# Patient Record
Sex: Female | Born: 1995 | Hispanic: No | Marital: Single | Smoking: Never smoker
Health system: Southern US, Community
[De-identification: ages and names within clinical notes are randomized; demographics above are authoritative.]

---
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
## 2013-01-25 ENCOUNTER — Encounter: Payer: Self-pay | Admitting: Internal Medicine

## 2013-01-25 ENCOUNTER — Ambulatory Visit (INDEPENDENT_AMBULATORY_CARE_PROVIDER_SITE_OTHER): Payer: Managed Care, Other (non HMO) | Admitting: Internal Medicine

## 2013-01-25 VITALS — BP 116/74 | HR 80 | Temp 97.7°F | Ht 68.5 in | Wt 153.0 lb

## 2013-01-25 DIAGNOSIS — Z0000 Encounter for general adult medical examination without abnormal findings: Secondary | ICD-10-CM

## 2013-01-25 DIAGNOSIS — Z7189 Other specified counseling: Secondary | ICD-10-CM

## 2013-01-25 DIAGNOSIS — Z7184 Encounter for health counseling related to travel: Secondary | ICD-10-CM

## 2013-01-25 DIAGNOSIS — Z3009 Encounter for other general counseling and advice on contraception: Secondary | ICD-10-CM

## 2013-01-25 DIAGNOSIS — Z23 Encounter for immunization: Secondary | ICD-10-CM

## 2013-01-25 LAB — POCT URINALYSIS DIPSTICK
Blood, UA: NEGATIVE
Glucose, UA: NEGATIVE
Nitrite, UA: NEGATIVE
Protein, UA: NEGATIVE
Urobilinogen, UA: NEGATIVE

## 2013-01-25 MED ORDER — TETANUS-DIPHTH-ACELL PERTUSSIS 5-2.5-18.5 LF-MCG/0.5 IM SUSP: 0.5000 mL | Freq: Once | INTRAMUSCULAR | Status: AC

## 2013-01-26 ENCOUNTER — Encounter: Payer: Self-pay | Admitting: Internal Medicine

## 2013-01-26 LAB — CHOLESTEROL, TOTAL: Cholesterol: 160 mg/dL (ref 0–169)

## 2013-01-26 LAB — CBC WITH DIFFERENTIAL/PLATELET
Basophils Absolute: 0 10*3/uL (ref 0.0–0.1)
Basophils Relative: 1 % (ref 0–1)
Eosinophils Absolute: 0 10*3/uL (ref 0.0–0.7)
Eosinophils Relative: 1 % (ref 0–5)
MCH: 29.1 pg (ref 26.0–34.0)
MCHC: 33 g/dL (ref 30.0–36.0)
Neutrophils Relative %: 65 % (ref 43–77)
Platelets: 276 10*3/uL (ref 150–400)
RBC: 4.92 MIL/uL (ref 3.87–5.11)
RDW: 13.4 % (ref 11.5–15.5)

## 2013-02-07 ENCOUNTER — Ambulatory Visit (INDEPENDENT_AMBULATORY_CARE_PROVIDER_SITE_OTHER): Payer: Managed Care, Other (non HMO) | Admitting: Internal Medicine

## 2013-02-07 DIAGNOSIS — Z23 Encounter for immunization: Secondary | ICD-10-CM

## 2013-02-07 MED ORDER — NORGESTIM-ETH ESTRAD TRIPHASIC 0.18/0.215/0.25 MG-25 MCG PO TABS: 1.0000 | ORAL_TABLET | Freq: Every day | ORAL | Status: DC

## 2013-02-20 NOTE — Progress Notes (Signed)
  Subjective:    Patient ID: Jasmin Brown, female    DOB: 1996/06/14, 17 y.o.   MRN: [id number]  HPI 17 year old White female who will be attending college Spring 2015. This fall, she is going to [place] to travel and study before starting her freshman year in college.   She has had the Gardasil series. Had chickenpox in 1996.  Social history: Does not use illicit drugs or consume alcohol. Has an older sister who is a Holiday representative in college.  Family history: Mother with history of migraine headaches and recurrent sinusitis. Father with history of hypertension and hyperlipidemia. Sister in good health with attention deficit issues.  No history of serious illnesses     Review of Systems  Constitutional: Negative.   All other systems reviewed and are negative.       Objective:   Physical Exam  Vitals reviewed. Constitutional: She is oriented to person, place, and time. She appears well-developed and well-nourished.  HENT:  Head: Normocephalic and atraumatic.  Right Ear: External ear normal.  Left Ear: External ear normal.  Mouth/Throat: Oropharynx is clear and moist. No oropharyngeal exudate.  Eyes: Conjunctivae and EOM are normal. Pupils are equal, round, and reactive to light. Right eye exhibits no discharge. Left eye exhibits no discharge. No scleral icterus.  Neck: Neck supple. No JVD present. No thyromegaly present.  Cardiovascular: Normal rate, regular rhythm, normal heart sounds and intact distal pulses.   No murmur heard. Pulmonary/Chest: Effort normal and breath sounds normal. No respiratory distress. She has no wheezes. She has no rales.  Breasts normal female- breast exam demonstrated  Abdominal: Soft. Bowel sounds are normal. She exhibits no distension and no mass. There is no tenderness. There is no rebound and no guarding.  Musculoskeletal: Normal range of motion. She exhibits no edema.  Lymphadenopathy:    She has no cervical adenopathy.  Neurological: She is alert  and oriented to person, place, and time. No cranial nerve deficit. Coordination normal.  Skin: Skin is warm and dry. No rash noted. She is not diaphoretic.  Psychiatric: She has a normal mood and affect. Her behavior is normal. Judgment and thought content normal.          Assessment & Plan:  Health maintenance exam  Travel advice  Plan: Given Cipro 500 mg twice daily for 7 days to take with her on trip to [place] as well as Phenergan 25 mg tablets #30 one by mouth every 4 hours when necessary nausea. Oral contraceptives prescribed for dysmenorrhea and menstrual irregularity.  Needs to have Pap smear in the near future. Tetanus immunization update given.

## 2013-02-20 NOTE — Patient Instructions (Addendum)
Take Cipro and Phenergan with you to [place]. Return one year or as needed.

## 2013-06-18 ENCOUNTER — Other Ambulatory Visit: Payer: Self-pay | Admitting: Internal Medicine

## 2013-06-18 NOTE — Telephone Encounter (Signed)
Refill for one month only and

## 2013-06-20 ENCOUNTER — Ambulatory Visit (INDEPENDENT_AMBULATORY_CARE_PROVIDER_SITE_OTHER): Payer: Managed Care, Other (non HMO) | Admitting: Internal Medicine

## 2013-06-20 ENCOUNTER — Other Ambulatory Visit (HOSPITAL_COMMUNITY)
Admission: RE | Admit: 2013-06-20 | Discharge: 2013-06-20 | Disposition: A | Discharge status: Home / Self Care | Payer: 59 | Source: Ambulatory Visit | Attending: Internal Medicine | Admitting: Internal Medicine

## 2013-06-20 ENCOUNTER — Encounter: Payer: Self-pay | Admitting: Internal Medicine

## 2013-06-20 ENCOUNTER — Ambulatory Visit
Admission: RE | Admit: 2013-06-20 | Discharge: 2013-06-20 | Disposition: A | Discharge status: Home / Self Care | Payer: 59 | Source: Ambulatory Visit | Attending: Internal Medicine | Admitting: Internal Medicine

## 2013-06-20 VITALS — BP 106/84 | HR 80 | Wt 176.0 lb

## 2013-06-20 DIAGNOSIS — Z124 Encounter for screening for malignant neoplasm of cervix: Secondary | ICD-10-CM

## 2013-06-20 DIAGNOSIS — R05 Cough: Secondary | ICD-10-CM

## 2013-06-20 DIAGNOSIS — Z01419 Encounter for gynecological examination (general) (routine) without abnormal findings: Secondary | ICD-10-CM | POA: Insufficient documentation

## 2013-06-20 DIAGNOSIS — J209 Acute bronchitis, unspecified: Secondary | ICD-10-CM

## 2013-06-20 DIAGNOSIS — Z23 Encounter for immunization: Secondary | ICD-10-CM

## 2013-06-20 MED ORDER — LEVOFLOXACIN 500 MG PO TABS: 500.0000 mg | ORAL_TABLET | Freq: Every day | ORAL | Status: AC

## 2013-06-30 NOTE — Progress Notes (Signed)
Patient informed. She is away at school now, but will call upon her return to [place].

## 2013-11-25 NOTE — Patient Instructions (Signed)
Take antibiotics as prescribed for bronchitis.

## 2013-11-25 NOTE — Progress Notes (Signed)
   Subjective:    Patient ID: Jasmin Brown, female    DOB: 1996-05-05, 18 y.o.   MRN: [id number]  HPI This fall, she studied and traveled in [place]. Will be starting college in January. This will be her freshman year. She is here for Pap smear but is on menstrual period. However she has come down with respiratory infection since arriving home from [place]. Has a cough and congestion. No shaking chills. Has had fever.    Review of Systems     Objective:   Physical Exam pharynx slightly injected. TMs slightly full. Neck supple. Chest scattered wheezing. Pap smear attempted.        Assessment & Plan:  Bronchitis  Plan: Levaquin 500 milligrams daily for 10 days by mouth. Chest x-ray performed shows no infiltrate.  Addendum: Pap smear is unsatisfactory due to scant cellularity. Patient will need to return for repeat Pap in the near future. Flu vaccine given.

## 2014-02-05 ENCOUNTER — Telehealth: Payer: Self-pay | Admitting: Internal Medicine

## 2014-02-05 MED ORDER — NORGESTIM-ETH ESTRAD TRIPHASIC 0.18/0.215/0.25 MG-25 MCG PO TABS: ORAL_TABLET | ORAL | Status: DC

## 2014-02-05 NOTE — Telephone Encounter (Signed)
Patient never returned for followup Pap smear which needed to be repeated due to scant cellularity and menses 06/20/2013. Patient is aware that she never returned a says she forgot. Now drugstores requesting refill on Ortho Tri-Cyclen Lo. Have given her one 28 day pack with 2 refills. I have spoken with patient personally today and she is aware she will need to have this Pap repeated in the next 3 months or will not get any more refills. She just returned to school today in [place]. She is asking if she can have Pap done in [place] and that would be acceptable if we have a copy.

## 2014-03-31 ENCOUNTER — Ambulatory Visit (INDEPENDENT_AMBULATORY_CARE_PROVIDER_SITE_OTHER): Payer: BC Managed Care – PPO | Admitting: Family Medicine

## 2014-03-31 VITALS — BP 118/72 | HR 84 | Temp 98.4°F | Resp 18 | Ht 68.5 in | Wt 159.6 lb

## 2014-03-31 DIAGNOSIS — Z3009 Encounter for other general counseling and advice on contraception: Secondary | ICD-10-CM

## 2014-03-31 DIAGNOSIS — N898 Other specified noninflammatory disorders of vagina: Secondary | ICD-10-CM

## 2014-03-31 LAB — POCT WET PREP WITH KOH
KOH Prep POC: NEGATIVE
TRICHOMONAS UA: NEGATIVE
WBC Wet Prep HPF POC: NEGATIVE
YEAST WET PREP PER HPF POC: NEGATIVE

## 2014-03-31 MED ORDER — NORGESTIM-ETH ESTRAD TRIPHASIC 0.18/0.215/0.25 MG-25 MCG PO TABS: 1.0000 | ORAL_TABLET | Freq: Every day | ORAL | Status: DC

## 2014-03-31 NOTE — Progress Notes (Signed)
This chart was scribed for Norberto SorensonEva Shaw, MD by Luisa DagoPriscilla Tutu, ED Scribe. This patient was seen in room 13 and the patient's care was started at 4:39 PM.  Subjective:    Patient ID: Jasmin Brown, female    DOB: 1995/09/17, 18 y.o.   MRN: [id number]  Chief Complaint  Patient presents with  . Gynecologic Exam    will need for birth control, fax results to GYN, she was booked and is having done here    HPI Jasmin Brown is a 18 y.o. female with no listed active problem list.  Pt is requesting a pap smear in order to get her birth control refilled. Advised pt that she is under the required age, and that I would be happy to refill her birth control medication. Pt agrees with the proposed course of treatment. Ms. Gaspar Biddingustay endorses mild vaginal discharge and being sexually active. She also endorses normal menstruation when on her birth control. Denies any spotting or adverse effects from her birth control pills.    There are no active problems to display for this patient.  History reviewed. No pertinent past medical history. History reviewed. No pertinent past surgical history. No Known Allergies Prior to Admission medications   Medication Sig Start Date End Date Taking? Authorizing Provider  lisdexamfetamine (VYVANSE) 40 MG capsule Take 40 mg by mouth every morning.   Yes Historical Provider, MD  Norgestimate-Ethinyl Estradiol Triphasic (ORTHO TRI-CYCLEN LO) 0.18/0.215/0.25 MG-25 MCG tab TAKE AS DIRECTED 02/05/14  Yes Margaree MackintoshMary J Baxley, MD  cetirizine (ZYRTEC) 10 MG tablet Take 10 mg by mouth daily.    Historical Provider, MD  levofloxacin (LEVAQUIN) 500 MG tablet Take 1 tablet (500 mg total) by mouth daily. 06/20/13   Margaree MackintoshMary J Baxley, MD  Multiple Vitamin (MULTIVITAMIN) capsule Take 1 capsule by mouth daily.    Historical Provider, MD   History   Social History  . Marital Status: Single    Spouse Name: N/A    Number of Children: N/A  . Years of Education: N/A   Occupational History  . Not on  file.   Social History Main Topics  . Smoking status: Never Smoker   . Smokeless tobacco: Never Used  . Alcohol Use: Yes  . Drug Use: No  . Sexual Activity: Not on file   Other Topics Concern  . Not on file   Social History Narrative  . No narrative on file   Review of Systems  Constitutional: Negative for fever, chills and diaphoresis.  HENT: Negative for congestion, ear discharge and sinus pressure.   Eyes: Negative for discharge.  Respiratory: Negative for cough.   Cardiovascular: Negative for chest pain.  Gastrointestinal: Negative for abdominal pain and diarrhea.  Genitourinary: Negative for frequency and hematuria.  Musculoskeletal: Negative for back pain.  Skin: Negative for rash.  Neurological: Negative for seizures and headaches.  Psychiatric/Behavioral: Negative for hallucinations.      Triage vitals: BP 118/72  Pulse 84  Temp(Src) 98.4 F (36.9 C) (Oral)  Resp 18  Ht 5' 8.5" (1.74 m)  Wt 159 lb 9.6 oz (72.394 kg)  BMI 23.91 kg/m2  SpO2 100%  LMP 03/02/2014  Objective:   Physical Exam  Nursing note and vitals reviewed. Constitutional: She appears well-developed and well-nourished. No distress.  HENT:  Head: Normocephalic and atraumatic.  Eyes: Conjunctivae are normal. Right eye exhibits no discharge. Left eye exhibits no discharge.  Neck: Neck supple.  Cardiovascular: Normal rate, regular rhythm and normal heart sounds.  Exam reveals no  gallop and no friction rub.   No murmur heard. Pulmonary/Chest: Effort normal and breath sounds normal. No respiratory distress.  Abdominal: Soft. She exhibits no distension. There is no tenderness.  Genitourinary: Vagina normal. No labial fusion. There is no rash, tenderness, lesion or injury on the right labia. There is no rash, tenderness, lesion or injury on the left labia. Cervix exhibits discharge. Cervix exhibits no motion tenderness and no friability. Right adnexum displays no mass, no tenderness and no fullness.  Left adnexum displays no mass, no tenderness and no fullness.  Moderate amount mucoid white vaginal discharge.  Musculoskeletal: She exhibits no edema and no tenderness.  Neurological: She is alert.  Skin: Skin is warm and dry.  Psychiatric: She has a normal mood and affect. Her behavior is normal. Thought content normal.          Assessment & Plan:   Vaginal discharge - Plan: POCT Wet Prep with KOH, GC/Chlamydia Probe Amp - pt reassured no abnormality  Family planning - Plan: POCT Wet Prep with KOH, GC/Chlamydia Probe Amp - Pt's PCP is Dr. Lorenz CoasterBaxter - she plans to follow-up w/ her but was only in [place] this weekend so needed to get her OC[facility] refilled now - [facility].  No pap smear until 18 yo - 2017. Pelvic exam nml w/ neg sti testing.  OCP refilled.  Meds ordered this encounter  Medications  . lisdexamfetamine (VYVANSE) 40 MG capsule    Sig: Take 40 mg by mouth every morning.  . Norgestimate-Ethinyl Estradiol Triphasic (ORTHO TRI-CYCLEN LO) 0.18/0.215/0.25 MG-25 MCG tab    Sig: Take 1 tablet by mouth daily. TAKE AS DIRECTED    Dispense:  3 Package    Refill:  4    I personally performed the services described in this documentation, which was scribed in my presence. The recorded information has been reviewed and considered, and addended by me as needed.  Norberto SorensonEva Shaw, MD MPH  Results for orders placed in visit on 03/31/14  GC/CHLAMYDIA PROBE AMP      Result Value Ref Range   CT Probe RNA NEGATIVE     GC Probe RNA NEGATIVE    POCT WET PREP WITH KOH      Result Value Ref Range   Trichomonas, UA Negative     Clue Cells Wet Prep HPF POC 0-1     Epithelial Wet Prep HPF POC 6-15     Yeast Wet Prep HPF POC neg     Bacteria Wet Prep HPF POC 3+     RBC Wet Prep HPF POC 0-4     WBC Wet Prep HPF POC neg     KOH Prep POC Negative

## 2014-03-31 NOTE — Patient Instructions (Signed)
Oral Contraception Information Oral contraceptive pills (OCPs) are medicines taken to prevent pregnancy. OCPs work by preventing the ovaries from releasing eggs. The hormones in OCPs also cause the cervical mucus to thicken, preventing the sperm from entering the uterus. The hormones also cause the uterine lining to become thin, not allowing a fertilized egg to attach to the inside of the uterus. OCPs are highly effective when taken exactly as prescribed. However, OCPs do not prevent sexually transmitted diseases (STDs). Safe sex practices, such as using condoms along with the pill, can help prevent STDs.  Before taking the pill, you may have a physical exam and Pap test. Your health care provider may order blood tests. The health care provider will make sure you are a good candidate for oral contraception. Discuss with your health care provider the possible side effects of the OCP you may be prescribed. When starting an OCP, it can take 2 to 3 months for the body to adjust to the changes in hormone levels in your body.  TYPES OF ORAL CONTRACEPTION  The combination pill--This pill contains estrogen and progestin (synthetic progesterone) hormones. The combination pill comes in 21-day, 28-day, or 91-day packs. Some types of combination pills are meant to be taken continuously (365-day pills). With 21-day packs, you do not take pills for 7 days after the last pill. With 28-day packs, the pill is taken every day. The last 7 pills are without hormones. Certain types of pills have more than 21 hormone-containing pills. With 91-day packs, the first 84 pills contain both hormones, and the last 7 pills contain no hormones or contain estrogen only.  The minipill--This pill contains the progesterone hormone only. The pill is taken every day continuously. It is very important to take the pill at the same time each day. The minipill comes in packs of 28 pills. All 28 pills contain the hormone.  ADVANTAGES OF ORAL  CONTRACEPTIVE PILLS  Decreases premenstrual symptoms.   Treats menstrual period cramps.   Regulates the menstrual cycle.   Decreases a heavy menstrual flow.   May treatacne, depending on the type of pill.   Treats abnormal uterine bleeding.   Treats polycystic ovarian syndrome.   Treats endometriosis.   Can be used as emergency contraception.  THINGS THAT CAN MAKE ORAL CONTRACEPTIVE PILLS LESS EFFECTIVE OCPs can be less effective if:   You forget to take the pill at the same time every day.   You have a stomach or intestinal disease that lessens the absorption of the pill.   You take OCPs with other medicines that make OCPs less effective, such as antibiotics, certain HIV medicines, and some seizure medicines.   You take expired OCPs.   You forget to restart the pill on day 7, when using the packs of 21 pills.  RISKS ASSOCIATED WITH ORAL CONTRACEPTIVE PILLS  Oral contraceptive pills can sometimes cause side effects, such as:  Headache.  Nausea.  Breast tenderness.  Irregular bleeding or spotting. Combination pills are also associated with a small increased risk of:  Blood clots.  Heart attack.  Stroke. Document Released: 08/29/2002 Document Revised: 03/29/2013 Document Reviewed: 11/27/2012 [facility] Patient Information 2015 [place], [place]. This information is not intended to replace advice given to you by your health care provider. Make sure you discuss any questions you have with your health care provider.  Oral Contraception Use Oral contraceptive pills (OCPs) are medicines taken to prevent pregnancy. OCPs work by preventing the ovaries from releasing eggs. The hormones in OCPs also  cause the cervical mucus to thicken, preventing the sperm from entering the uterus. The hormones also cause the uterine lining to become thin, not allowing a fertilized egg to attach to the inside of the uterus. OCPs are highly effective when taken exactly as  prescribed. However, OCPs do not prevent sexually transmitted diseases (STDs). Safe sex practices, such as using condoms along with an OCP, can help prevent STDs. Before taking OCPs, you may have a physical exam and Pap test. Your health care provider may also order blood tests if necessary. Your health care provider will make sure you are a good candidate for oral contraception. Discuss with your health care provider the possible side effects of the OCP you may be prescribed. When starting an OCP, it can take 2 to 3 months for the body to adjust to the changes in hormone levels in your body.  HOW TO TAKE ORAL CONTRACEPTIVE PILLS Your health care provider may advise you on how to start taking the first cycle of OCPs. Otherwise, you can:   Start on day 1 of your menstrual period. You will not need any backup contraceptive protection with this start time.   Start on the first Sunday after your menstrual period or the day you get your prescription. In these cases, you will need to use backup contraceptive protection for the first week.   Start the pill at any time of your cycle. If you take the pill within 5 days of the start of your period, you are protected against pregnancy right away. In this case, you will not need a backup form of birth control. If you start at any other time of your menstrual cycle, you will need to use another form of birth control for 7 days. If your OCP is the type called a minipill, it will protect you from pregnancy after taking it for 2 days (48 hours). After you have started taking OCPs:   If you forget to take 1 pill, take it as soon as you remember. Take the next pill at the regular time.   If you miss 2 or more pills, call your health care provider because different pills have different instructions for missed doses. Use backup birth control until your next menstrual period starts.   If you use a 28-day pack that contains inactive pills and you miss 1 of the last 7  pills (pills with no hormones), it will not matter. Throw away the rest of the non-hormone pills and start a new pill pack.  No matter which day you start the OCP, you will always start a new pack on that same day of the week. Have an extra pack of OCPs and a backup contraceptive method available in case you miss some pills or lose your OCP pack.  HOME CARE INSTRUCTIONS   Do not smoke.   Always use a condom to protect against STDs. OCPs do not protect against STDs.   Use a calendar to mark your menstrual period days.   Read the information and directions that came with your OCP. Talk to your health care provider if you have questions.  SEEK MEDICAL CARE IF:   You develop nausea and vomiting.   You have abnormal vaginal discharge or bleeding.   You develop a rash.   You miss your menstrual period.   You are losing your hair.   You need treatment for mood swings or depression.   You get dizzy when taking the OCP.   You develop acne  from taking the OCP.   You become pregnant.  SEEK IMMEDIATE MEDICAL CARE IF:   You develop chest pain.   You develop shortness of breath.   You have an uncontrolled or severe headache.   You develop numbness or slurred speech.   You develop visual problems.   You develop pain, redness, and swelling in the legs.  Document Released: 05/28/2011 Document Revised: 10/23/2013 Document Reviewed: 11/27/2012 [facility] Patient Information 2015 [place]. This information is not intended to replace advice given to you by your health care provider. Make sure you discuss any questions you have with your health care provider. Keeping You Healthy  Get These Tests 1. Blood Pressure- Have your blood pressure checked once a year by your health care provider.  Normal blood pressure is 120/80. 2. Weight- Have your body mass index (BMI) calculated to screen for obesity.  BMI is measure of body fat based on height and weight.  You can also  calculate your own BMI at https://www.west-esparza.com/www.nhlbisupport.com/bmi/. 3. Cholesterol- Have your cholesterol checked every 5 years starting at age 20 then yearly starting at age 20. 4. Chlamydia, HIV, and other sexually transmitted diseases- Get screened every year until age 20, then within three months of each new sexual provider. 5. Pap Smear- Every 1-3 years; discuss with your health care provider. 6. Mammogram- Every year starting at age 20  Take these medicines  Calcium with Vitamin D-Your body needs 1200 mg of Calcium each day and [phone] IU of Vitamin D daily.  Your body can only absorb 500 mg of Calcium at a time so Calcium must be taken in 2 or 3 divided doses throughout the day.  Multivitamin with folic acid- Once daily if it is possible for you to become pregnant.  Get these Immunizations  Gardasil-Series of three doses; prevents HPV related illness such as genital warts and cervical cancer.  Menactra-Single dose; prevents meningitis.  Tetanus shot- Every 10 years.  Flu shot-Every year.  Take these steps 1. Do not smoke-Your healthcare provider can help you quit.  For tips on how to quit go to www.smokefree.gov or call 1-800 QUITNOW. 2. Be physically active- Exercise 5 days a week for at least 30 minutes.  If you are not already physically active, start slow and gradually work up to 30 minutes of moderate physical activity.  Examples of moderate activity include walking briskly, dancing, swimming, bicycling, etc. 3. Breast Cancer- A self breast exam every month is important for early detection of breast cancer.  For more information and instruction on self breast exams, ask your healthcare provider or [place].eswww.womenshealth.gov/faq/breast-self-exam.cfm. 4. Eat a healthy diet- Eat a variety of healthy foods such as fruits, vegetables, whole grains, low fat milk, low fat cheeses, yogurt, lean meats, poultry and fish, beans, nuts, tofu, etc.  For more information go to www.  Thenutritionsource.org 5. Drink alcohol in moderation- Limit alcohol intake to one drink or less per day. Never drink and drive. 6. Depression- Your emotional health is as important as your physical health.  If you're feeling down or losing interest in things you normally enjoy please talk to your healthcare provider about being screened for depression. 7. Dental visit- Brush and floss your teeth twice daily; visit your dentist twice a year. 8. Eye doctor- Get an eye exam at least every 2 years. 9. Helmet use- Always wear a helmet when riding a bicycle, motorcycle, rollerblading or skateboarding. 10. Safe sex- If you may be exposed to sexually transmitted infections, use a condom.  11. Seat belts- Seat belts can save your live; always wear one. 12. Smoke/Carbon Monoxide detectors- These detectors need to be installed on the appropriate level of your home. Replace batteries at least once a year. 13. Skin cancer- When out in the sun please cover up and use sunscreen 15 SPF or higher. 14. Violence- If anyone is threatening or hurting you, please tell your healthcare provider.

## 2014-04-02 LAB — GC/CHLAMYDIA PROBE AMP
CT PROBE, AMP APTIMA: NEGATIVE
GC Probe RNA: NEGATIVE

## 2014-04-04 ENCOUNTER — Encounter: Payer: Self-pay | Admitting: Family Medicine

## 2015-05-30 ENCOUNTER — Other Ambulatory Visit: Payer: Self-pay | Admitting: Family Medicine

## 2017-10-22 ENCOUNTER — Ambulatory Visit (HOSPITAL_COMMUNITY)
Admission: EM | Admit: 2017-10-22 | Discharge: 2017-10-22 | Disposition: A | Discharge status: Home / Self Care | Payer: BLUE CROSS/BLUE SHIELD | Attending: Internal Medicine | Admitting: Internal Medicine

## 2017-10-22 ENCOUNTER — Ambulatory Visit (INDEPENDENT_AMBULATORY_CARE_PROVIDER_SITE_OTHER): Payer: BLUE CROSS/BLUE SHIELD

## 2017-10-22 ENCOUNTER — Encounter (HOSPITAL_COMMUNITY): Payer: Self-pay

## 2017-10-22 ENCOUNTER — Other Ambulatory Visit: Payer: Self-pay

## 2017-10-22 DIAGNOSIS — S93602A Unspecified sprain of left foot, initial encounter: Secondary | ICD-10-CM

## 2017-10-22 MED ORDER — IBUPROFEN 800 MG PO TABS
800.0000 mg | ORAL_TABLET | Freq: Once | ORAL | Status: AC
  Administered 2017-10-22: 800 mg via ORAL

## 2017-10-22 MED ORDER — IBUPROFEN 800 MG PO TABS
ORAL_TABLET | ORAL | Status: AC
  Filled 2017-10-22: qty 1

## 2017-10-22 NOTE — ED Triage Notes (Signed)
Pt presents with complaints of hurting her left foot during playing basketball. Swelling present.

## 2017-10-22 NOTE — Discharge Instructions (Signed)
Use anti-inflammatories for pain/swelling. You may take up to 800 mg Ibuprofen every 8 hours with food. You may supplement Ibuprofen with Tylenol [phone] mg every 8 hours. Or aleeve  I expect gradual resolution over the next 2 weeks.  Use crutches and postop shoe as needed for comfort.  Slowly transition back into weightbearing.  Please follow-up if symptoms not improving as expected or symptoms are worsening.

## 2017-10-22 NOTE — ED Provider Notes (Signed)
[facility]    CSN: [id number] Arrival date & time: 10/22/17  7829     History   Chief Complaint Chief Complaint  Patient presents with  . Foot Pain    HPI Jasmin Brown is a 22 y.o. female presenting today for evaluation of left foot injury.  Patient was playing basketball earlier today and landed on the edge of a raised basketball court, half of her foot was on the court half is not.  Patient fell.  She is unsure exactly how her foot landed.  Since she has had pain, swelling and significant pain with weightbearing.  Denies numbness or tingling.  Iced her foot earlier, but is not taking any anti-inflammatories.  HPI  History reviewed. No pertinent past medical history.  There are no active problems to display for this patient.   No past surgical history on file.  OB History   None      Home Medications    Prior to Admission medications   Medication Sig Start Date End Date Taking? Authorizing Provider  lisdexamfetamine (VYVANSE) 40 MG capsule Take 40 mg by mouth every morning.   Yes [provider]  cetirizine (ZYRTEC) 10 MG tablet Take 10 mg by mouth daily.    [provider]  levofloxacin (LEVAQUIN) 500 MG tablet Take 1 tablet (500 mg total) by mouth daily. 06/20/13   Margaree Mackintosh, MD  Multiple Vitamin (MULTIVITAMIN) capsule Take 1 capsule by mouth daily.    [provider]  Norgestimate-Ethinyl Estradiol Triphasic 0.18/0.215/0.25 MG-25 MCG tab Take 1 tablet by mouth daily. PATIENT NEEDS OFFICE VISIT FOR ADDITIONAL REFILLS 05/31/15   Wallis Bamberg, PA-C    Family History Family History  Problem Relation Age of Onset  . Hypertension Father     Social History Social History   Tobacco Use  . Smoking status: Never Smoker  . Smokeless tobacco: Never Used  Substance Use Topics  . Alcohol use: Yes  . Drug use: No     Allergies   Patient has no known allergies.   Review of Systems Review of Systems  Constitutional:  Negative for activity change and appetite change.  HENT: Negative for trouble swallowing.   Eyes: Negative for pain and visual disturbance.  Respiratory: Negative for shortness of breath.   Cardiovascular: Negative for chest pain.  Gastrointestinal: Negative for abdominal pain, nausea and vomiting.  Musculoskeletal: Positive for arthralgias, gait problem and myalgias. Negative for back pain, neck pain and neck stiffness.  Skin: Negative for color change and wound.  Neurological: Negative for dizziness, seizures, syncope, weakness, light-headedness, numbness and headaches.     Physical Exam Triage Vital Signs ED Triage Vitals [10/22/17 1932]  Enc Vitals Group     BP (!) 145/97     Pulse Rate 99     Resp 18     Temp 98 F (36.7 C)     Temp src      SpO2 100 %     Weight 159 lb (72.1 kg)     Height      Head Circumference      Peak Flow      Pain Score 6     Pain Loc      Pain Edu?      Excl. in GC?    No data found.  Updated Vital Signs BP (!) 145/97   Pulse 99   Temp 98 F (36.7 C)   Resp 18   Wt 159 lb (72.1 kg)  LMP 09/22/2017   SpO2 100%   BMI 23.82 kg/m   Visual Acuity Right Eye Distance:   Left Eye Distance:   Bilateral Distance:    Right Eye Near:   Left Eye Near:    Bilateral Near:     Physical Exam  Constitutional: She appears well-developed and well-nourished. No distress.  HENT:  Head: Normocephalic and atraumatic.  Eyes: Conjunctivae are normal.  Neck: Neck supple.  Cardiovascular: Normal rate.  No murmur heard. Pulmonary/Chest: Effort normal. No respiratory distress.  Musculoskeletal: She exhibits tenderness. She exhibits no edema.  Mild swelling diffusely across metacarpals. tenderness across first through fifth metacarpals on plantar and dorsal surface of foot.  Dorsalis pedis 2+.  Cap refill less than 2 seconds, able to wiggle toes.  Mild tenderness to palpation just inferior to lateral malleolus, nontender over medial malleolus    Neurological: She is alert.  Skin: Skin is warm and dry.  Psychiatric: She has a normal mood and affect.  Nursing note and vitals reviewed.    UC Treatments / Results  Labs (all labs ordered are listed, but only abnormal results are displayed) Labs Reviewed - No data to display  EKG None  Radiology Dg Foot Complete Left  Result Date: 10/22/2017 CLINICAL DATA:  Swelling, injury EXAM: LEFT FOOT - COMPLETE 3+ VIEW COMPARISON:  None. FINDINGS: There is no evidence of fracture or dislocation. There is no evidence of arthropathy or other focal bone abnormality. Soft tissues are unremarkable. Ossicle adjacent to the dorsal navicular. IMPRESSION: No acute osseous abnormality. Electronically Signed   By: Jasmine Pang M.D.   On: 10/22/2017 19:58    Procedures Procedures (including critical care time)  Medications Ordered in UC Medications  ibuprofen (ADVIL,MOTRIN) tablet 800 mg (800 mg Oral Given 10/22/17 1959)    Initial Impression / Assessment and Plan / UC Course  I have reviewed the triage vital signs and the nursing notes.  Pertinent labs & imaging results that were available during my care of the patient were reviewed by me and considered in my medical decision making (see chart for details).     X-ray negative for acute fracture.  Likely foot sprain.  Will provide postop shoe, crutches.  Rest, anti-inflammatories and ice. Discussed strict return precautions. Patient verbalized understanding and is agreeable with plan.  Final Clinical Impressions(s) / UC Diagnoses   Final diagnoses:  Sprain of left foot, initial encounter     Discharge Instructions     Use anti-inflammatories for pain/swelling. You may take up to 800 mg Ibuprofen every 8 hours with food. You may supplement Ibuprofen with Tylenol [phone] mg every 8 hours. Or aleeve  I expect gradual resolution over the next 2 weeks.  Use crutches and postop shoe as needed for comfort.  Slowly transition back into  weightbearing.  Please follow-up if symptoms not improving as expected or symptoms are worsening.   ED Prescriptions    None     Controlled Substance Prescriptions [place] Controlled Substance Registry consulted? Not Applicable   Lew Dawes, [place] 10/22/17 2059

## 2019-07-25 IMAGING — DX DG FOOT COMPLETE 3+V*L*
3 series · 3 of 3 positions shown · non-contrast
Comparison: None.

CLINICAL DATA: Swelling, injury

EXAM:
LEFT FOOT - COMPLETE 3+ VIEW

[foot ap]
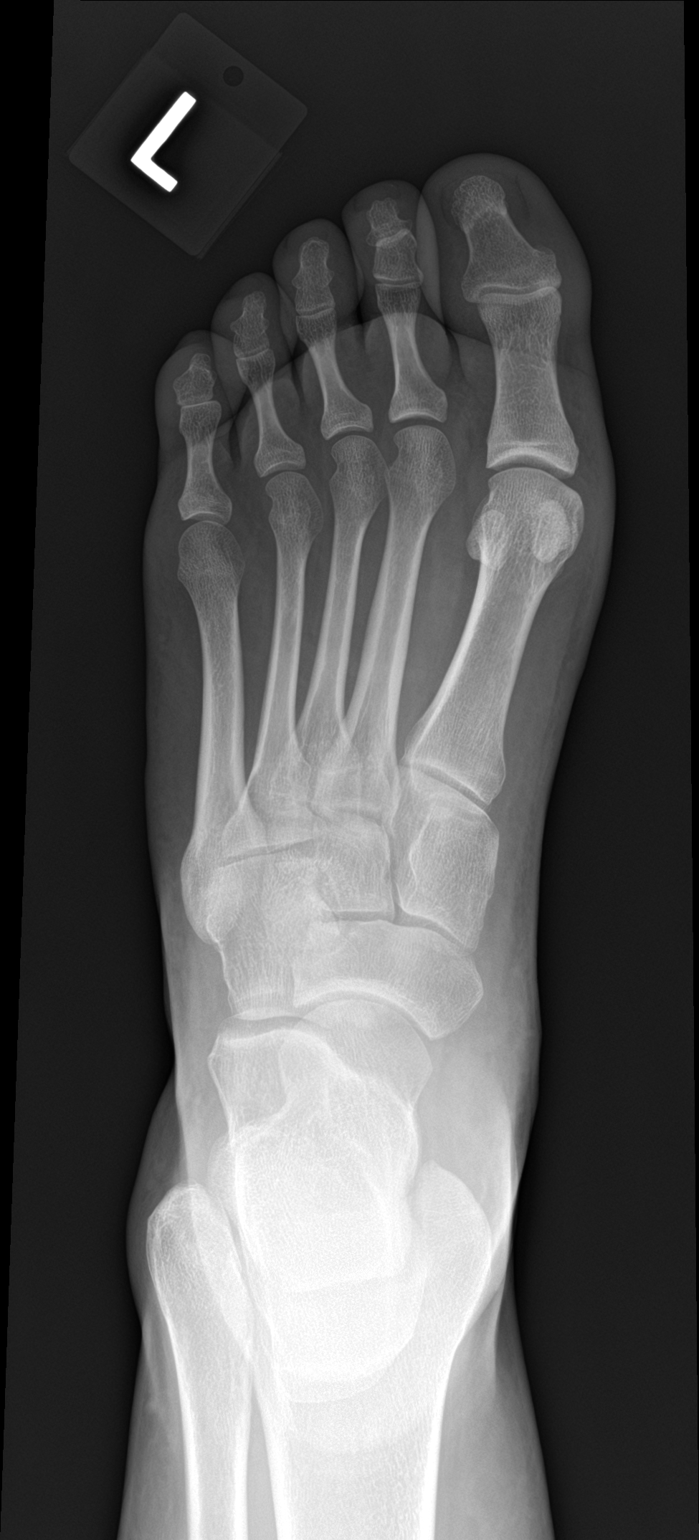

[foot obl]
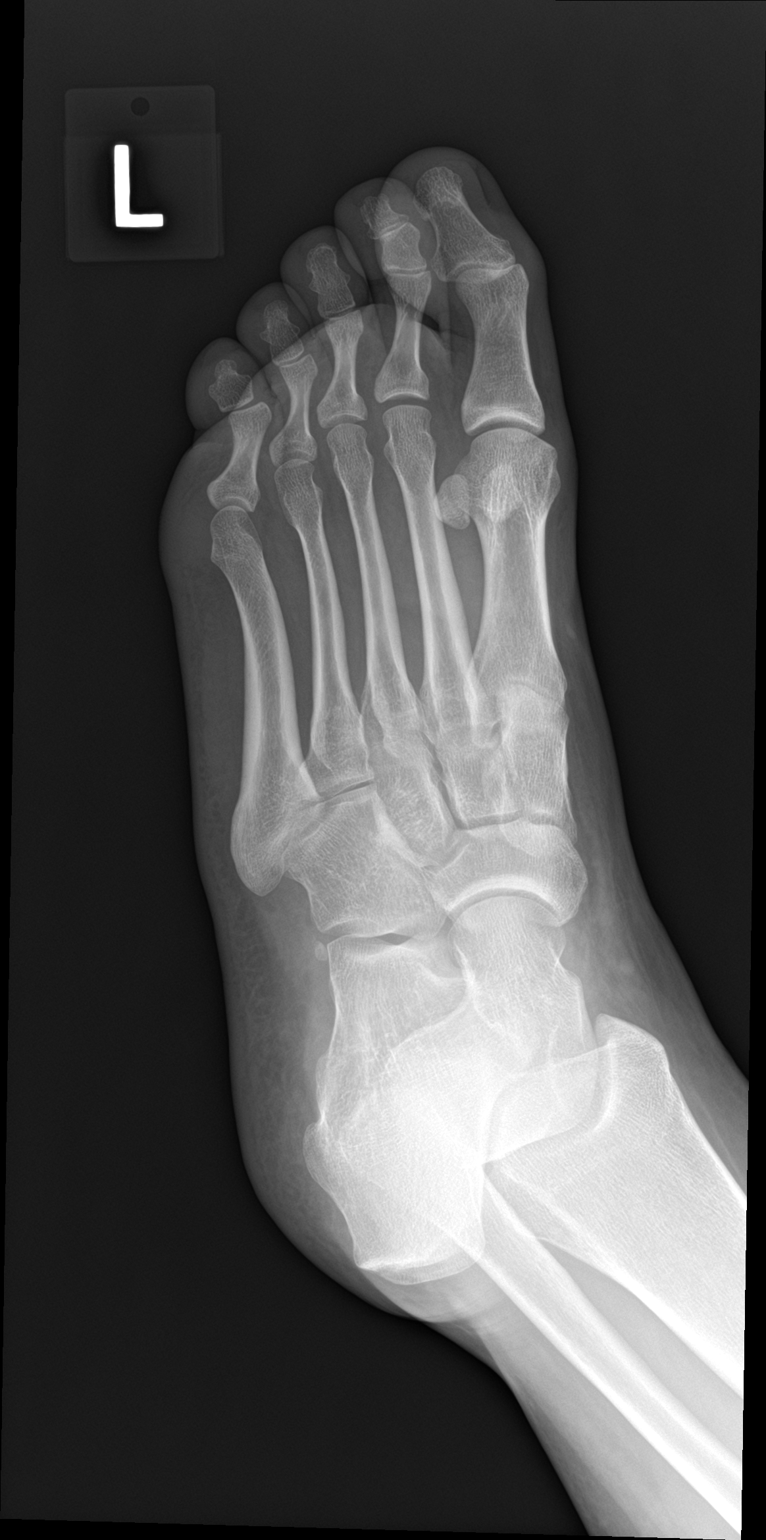

[foot lat]
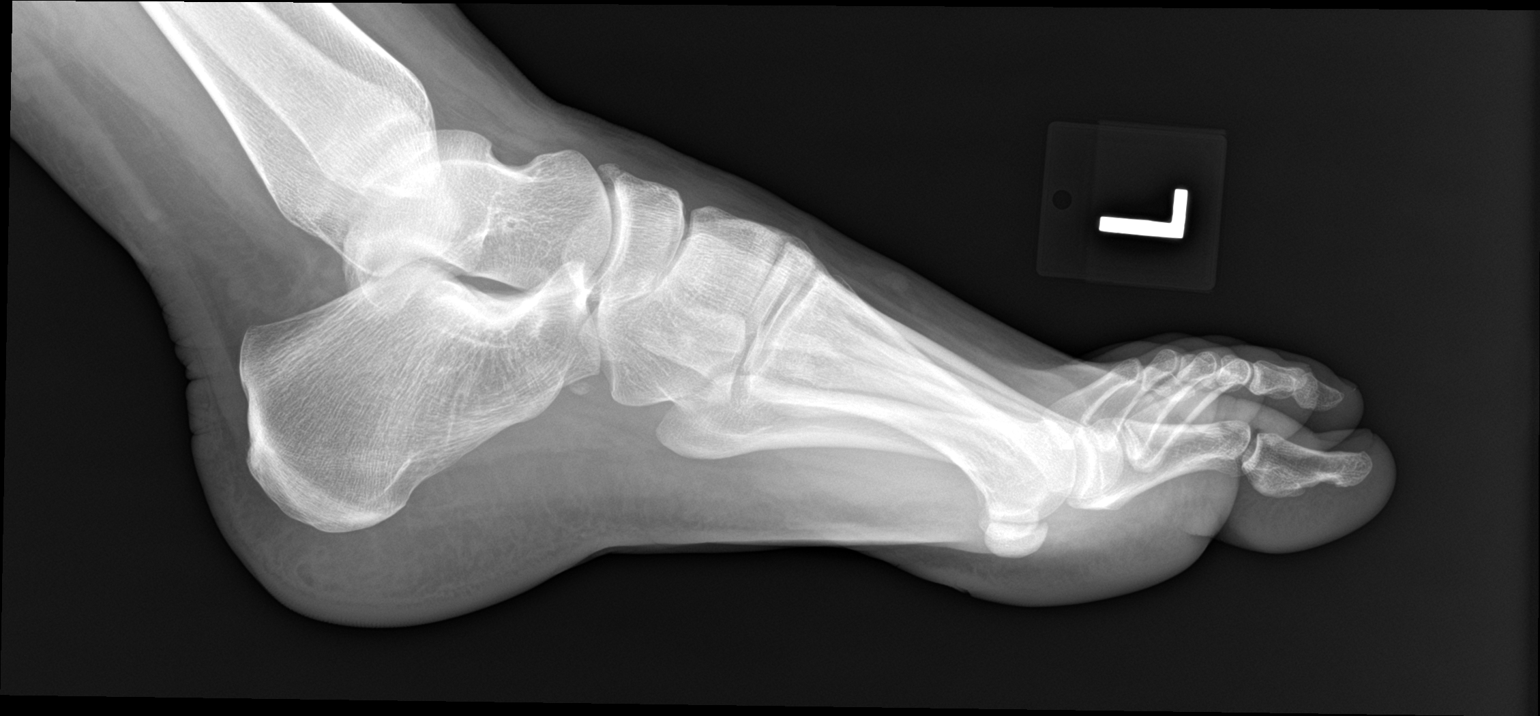

[3 of 3 positions shown; findings below may reference images not displayed]

FINDINGS: There is no evidence of fracture or dislocation. There is no
evidence of arthropathy or other focal bone abnormality. Soft
tissues are unremarkable. Ossicle adjacent to the dorsal navicular.
IMPRESSION: No acute osseous abnormality.
